# Patient Record
Sex: Female | Born: 1965 | Race: Black or African American | Hispanic: No | Marital: Married | Smoking: Never smoker
Health system: Southern US, Community
[De-identification: ages and names within clinical notes are randomized; demographics above are authoritative.]

## PROBLEM LIST (undated history)

## (undated) DIAGNOSIS — D573 Sickle-cell trait: Secondary | ICD-10-CM

## (undated) DIAGNOSIS — I10 Essential (primary) hypertension: Secondary | ICD-10-CM

## (undated) DIAGNOSIS — F32A Depression, unspecified: Secondary | ICD-10-CM

## (undated) HISTORY — PX: LAPAROSCOPIC GASTRIC BANDING: SHX1100

## (undated) HISTORY — PX: ABDOMINAL HYSTERECTOMY: SHX81

---

## 2015-02-20 ENCOUNTER — Ambulatory Visit
Admission: RE | Admit: 2015-02-20 | Discharge: 2015-02-20 | Disposition: A | Discharge status: Home / Self Care | Payer: Disability Insurance | Source: Ambulatory Visit | Attending: Physical Medicine and Rehabilitation | Admitting: Physical Medicine and Rehabilitation

## 2015-02-20 ENCOUNTER — Other Ambulatory Visit: Payer: Self-pay | Admitting: Physical Medicine and Rehabilitation

## 2015-02-20 DIAGNOSIS — M19071 Primary osteoarthritis, right ankle and foot: Secondary | ICD-10-CM | POA: Diagnosis not present

## 2015-02-20 DIAGNOSIS — M25559 Pain in unspecified hip: Secondary | ICD-10-CM

## 2015-02-20 DIAGNOSIS — S82891S Other fracture of right lower leg, sequela: Secondary | ICD-10-CM

## 2015-02-20 DIAGNOSIS — Z9889 Other specified postprocedural states: Secondary | ICD-10-CM | POA: Diagnosis not present

## 2015-02-20 DIAGNOSIS — M25551 Pain in right hip: Secondary | ICD-10-CM | POA: Insufficient documentation

## 2015-06-05 ENCOUNTER — Encounter: Payer: Self-pay | Admitting: Emergency Medicine

## 2015-06-05 ENCOUNTER — Ambulatory Visit
Admission: EM | Admit: 2015-06-05 | Discharge: 2015-06-05 | Disposition: A | Discharge status: Home / Self Care | Payer: Self-pay | Attending: Family Medicine | Admitting: Family Medicine

## 2015-06-05 ENCOUNTER — Ambulatory Visit (INDEPENDENT_AMBULATORY_CARE_PROVIDER_SITE_OTHER): Payer: Disability Insurance

## 2015-06-05 DIAGNOSIS — A499 Bacterial infection, unspecified: Secondary | ICD-10-CM

## 2015-06-05 DIAGNOSIS — S8012XA Contusion of left lower leg, initial encounter: Secondary | ICD-10-CM

## 2015-06-05 DIAGNOSIS — M62830 Muscle spasm of back: Secondary | ICD-10-CM

## 2015-06-05 DIAGNOSIS — N760 Acute vaginitis: Secondary | ICD-10-CM

## 2015-06-05 DIAGNOSIS — B9689 Other specified bacterial agents as the cause of diseases classified elsewhere: Secondary | ICD-10-CM

## 2015-06-05 DIAGNOSIS — B373 Candidiasis of vulva and vagina: Secondary | ICD-10-CM

## 2015-06-05 DIAGNOSIS — B3731 Acute candidiasis of vulva and vagina: Secondary | ICD-10-CM

## 2015-06-05 LAB — URINALYSIS COMPLETE WITH MICROSCOPIC (ARMC ONLY)
Bacteria, UA: NONE SEEN
GLUCOSE, UA: NEGATIVE mg/dL
Hgb urine dipstick: NEGATIVE
LEUKOCYTES UA: NEGATIVE
NITRITE: NEGATIVE
PH: 6 (ref 5.0–8.0)
PROTEIN: NEGATIVE mg/dL
RBC / HPF: NONE SEEN RBC/hpf (ref 0–5)
Specific Gravity, Urine: 1.025 (ref 1.005–1.030)

## 2015-06-05 LAB — WET PREP, GENITAL
Clue Cells Wet Prep HPF POC: NONE SEEN
Sperm: NONE SEEN
Trich, Wet Prep: NONE SEEN
WBC, Wet Prep HPF POC: NONE SEEN

## 2015-06-05 MED ORDER — KETOROLAC TROMETHAMINE 60 MG/2ML IM SOLN
60.0000 mg | Freq: Once | INTRAMUSCULAR | Status: AC
  Administered 2015-06-05: 60 mg via INTRAMUSCULAR

## 2015-06-05 MED ORDER — TERCONAZOLE 0.8 % VA CREA: 1.0000 | TOPICAL_CREAM | Freq: Every day | VAGINAL | Status: AC

## 2015-06-05 MED ORDER — METAXALONE 800 MG PO TABS: 800.0000 mg | ORAL_TABLET | Freq: Three times a day (TID) | ORAL | Status: AC

## 2015-06-05 MED ORDER — NAPROXEN 500 MG PO TABS: 500.0000 mg | ORAL_TABLET | Freq: Two times a day (BID) | ORAL | Status: AC

## 2015-06-05 MED ORDER — METRONIDAZOLE 500 MG PO TABS: 500.0000 mg | ORAL_TABLET | Freq: Two times a day (BID) | ORAL | Status: DC

## 2015-06-05 NOTE — Discharge Instructions (Signed)
Bacterial Vaginosis Bacterial vaginosis is an infection of the vagina. It happens when too many germs (bacteria) grow in the vagina. Having this infection puts you at risk for getting other infections from sex. Treating this infection can help lower your risk for other infections, such as:   Chlamydia.  Gonorrhea.  HIV.  Herpes. HOME CARE  Take your medicine as told by your doctor.  Finish your medicine even if you start to feel better.  Tell your sex partner that you have an infection. They should see their doctor for treatment.  During treatment:  Avoid sex or use condoms correctly.  Do not douche.  Do not drink alcohol unless your doctor tells you it is ok.  Do not breastfeed unless your doctor tells you it is ok. GET HELP IF:  You are not getting better after 3 days of treatment.  You have more grey fluid (discharge) coming from your vagina than before.  You have more pain than before.  You have a fever. MAKE SURE YOU:   Understand these instructions.  Will watch your condition.  Will get help right away if you are not doing well or get worse.   This information is not intended to replace advice given to you by your health care provider. Make sure you discuss any questions you have with your health care provider.   Document Released: 12/04/2007 Document Revised: 03/17/2014 Document Reviewed: 10/06/2012 Elsevier Interactive Patient Education 2016 Elsevier Inc.  Muscle Cramps and Spasms Muscle cramps and spasms are when muscles tighten by themselves. They usually get better within minutes. Muscle cramps are painful. They are usually stronger and last longer than muscle spasms. Muscle spasms may or may not be painful. They can last a few seconds or much longer. HOME CARE  Drink enough fluid to keep your pee (urine) clear or pale yellow.  Massage, stretch, and relax the muscle.  Use a warm towel, heating pad, or warm shower water on tight muscles.  Place  ice on the muscle if it is tender or in pain.  Put ice in a plastic bag.  Place a towel between your skin and the bag.  Leave the ice on for 15-20 minutes, 03-04 times a day.  Only take medicine as told by your doctor. GET HELP RIGHT AWAY IF:  Your cramps or spasms get worse, happen more often, or do not get better with time. MAKE SURE YOU:  Understand these instructions.  Will watch your condition.  Will get help right away if you are not doing well or get worse.   This information is not intended to replace advice given to you by your health care provider. Make sure you discuss any questions you have with your health care provider.   Document Released: 02/07/2008 Document Revised: 06/21/2012 Document Reviewed: 02/11/2012 Elsevier Interactive Patient Education 2016 [employer].  Vaginitis Vaginitis is an inflammation of the vagina. It is most often caused by a change in the normal balance of the bacteria and yeast that live in the vagina. This change in balance causes an overgrowth of certain bacteria or yeast, which causes the inflammation. There are different types of vaginitis, but the most common types are:  Bacterial vaginosis.  Yeast infection (candidiasis).  Trichomoniasis vaginitis. This is a sexually transmitted infection (STI).  Viral vaginitis.  Atrophic vaginitis.  Allergic vaginitis. CAUSES  The cause depends on the type of vaginitis. Vaginitis can be caused by:  Bacteria (bacterial vaginosis).  Yeast (yeast infection).  A parasite (trichomoniasis  vaginitis)  A virus (viral vaginitis).  Low hormone levels (atrophic vaginitis). Low hormone levels can occur during pregnancy, breastfeeding, or after menopause.  Irritants, such as bubble baths, scented tampons, and feminine sprays (allergic vaginitis). Other factors can change the normal balance of the yeast and bacteria that live in the vagina. These include:  Antibiotic medicines.  Poor  hygiene.  Diaphragms, vaginal sponges, spermicides, birth control pills, and intrauterine devices (IUD).  Sexual intercourse.  Infection.  Uncontrolled diabetes.  A weakened immune system. SYMPTOMS  Symptoms can vary depending on the cause of the vaginitis. Common symptoms include:  Abnormal vaginal discharge.  The discharge is white, gray, or yellow with bacterial vaginosis.  The discharge is thick, white, and cheesy with a yeast infection.  The discharge is frothy and yellow or greenish with trichomoniasis.  A bad vaginal odor.  The odor is fishy with bacterial vaginosis.  Vaginal itching, pain, or swelling.  Painful intercourse.  Pain or burning when urinating. Sometimes, there are no symptoms. TREATMENT  Treatment will vary depending on the type of infection.   Bacterial vaginosis and trichomoniasis are often treated with antibiotic creams or pills.  Yeast infections are often treated with antifungal medicines, such as vaginal creams or suppositories.  Viral vaginitis has no cure, but symptoms can be treated with medicines that relieve discomfort. Your sexual partner should be treated as well.  Atrophic vaginitis may be treated with an estrogen cream, pill, suppository, or vaginal ring. If vaginal dryness occurs, lubricants and moisturizing creams may help. You may be told to avoid scented soaps, sprays, or douches.  Allergic vaginitis treatment involves quitting the use of the product that is causing the problem. Vaginal creams can be used to treat the symptoms. HOME CARE INSTRUCTIONS   Take all medicines as directed by your caregiver.  Keep your genital area clean and dry. Avoid soap and only rinse the area with water.  Avoid douching. It can remove the healthy bacteria in the vagina.  Do not use tampons or have sexual intercourse until your vaginitis has been treated. Use sanitary pads while you have vaginitis.  Wipe from front to back. This avoids the  spread of bacteria from the rectum to the vagina.  Let air reach your genital area.  Wear cotton underwear to decrease moisture buildup.  Avoid wearing underwear while you sleep until your vaginitis is gone.  Avoid tight pants and underwear or nylons without a cotton panel.  Take off wet clothing (especially bathing suits) as soon as possible.  Use mild, non-scented products. Avoid using irritants, such as:  Scented feminine sprays.  Fabric softeners.  Scented detergents.  Scented tampons.  Scented soaps or bubble baths.  Practice safe sex and use condoms. Condoms may prevent the spread of trichomoniasis and viral vaginitis. SEEK MEDICAL CARE IF:   You have abdominal pain.  You have a fever or persistent symptoms for more than 2-3 days.  You have a fever and your symptoms suddenly get worse.   This information is not intended to replace advice given to you by your health care provider. Make sure you discuss any questions you have with your health care provider.   Document Released: 12/22/2006 Document Revised: 07/11/2014 Document Reviewed: 08/07/2011 Elsevier Interactive Patient Education 2016 Elsevier Inc.  Monilial Vaginitis Vaginitis in a soreness, swelling and redness (inflammation) of the vagina and vulva. Monilial vaginitis is not a sexually transmitted infection. CAUSES  Yeast vaginitis is caused by yeast (candida) that is normally found in your  vagina. With a yeast infection, the candida has overgrown in number to a point that upsets the chemical balance. SYMPTOMS   White, thick vaginal discharge.  Swelling, itching, redness and irritation of the vagina and possibly the lips of the vagina (vulva).  Burning or painful urination.  Painful intercourse. DIAGNOSIS  Things that may contribute to monilial vaginitis are:  Postmenopausal and virginal states.  Pregnancy.  Infections.  Being tired, sick or stressed, especially if you had monilial vaginitis in  the past.  Diabetes. Good control will help lower the chance.  Birth control pills.  Tight fitting garments.  Using bubble bath, feminine sprays, douches or deodorant tampons.  Taking certain medications that kill germs (antibiotics).  Sporadic recurrence can occur if you become ill. TREATMENT  Your caregiver will give you medication.  There are several kinds of anti monilial vaginal creams and suppositories specific for monilial vaginitis. For recurrent yeast infections, use a suppository or cream in the vagina 2 times a week, or as directed.  Anti-monilial or steroid cream for the itching or irritation of the vulva may also be used. Get your caregiver's permission.  Painting the vagina with methylene blue solution may help if the monilial cream does not work.  Eating yogurt may help prevent monilial vaginitis. HOME CARE INSTRUCTIONS   Finish all medication as prescribed.  Do not have sex until treatment is completed or after your caregiver tells you it is okay.  Take warm sitz baths.  Do not douche.  Do not use tampons, especially scented ones.  Wear cotton underwear.  Avoid tight pants and panty hose.  Tell your sexual partner that you have a yeast infection. They should go to their caregiver if they have symptoms such as mild rash or itching.  Your sexual partner should be treated as well if your infection is difficult to eliminate.  Practice safer sex. Use condoms.  Some vaginal medications cause latex condoms to fail. Vaginal medications that harm condoms are:  Cleocin cream.  Butoconazole (Femstat).  Terconazole (Terazol) vaginal suppository.  Miconazole (Monistat) (may be purchased over the counter). SEEK MEDICAL CARE IF:   You have a temperature by mouth above 102 F (38.9 C).  The infection is getting worse after 2 days of treatment.  The infection is not getting better after 3 days of treatment.  You develop blisters in or around your  vagina.  You develop vaginal bleeding, and it is not your menstrual period.  You have pain when you urinate.  You develop intestinal problems.  You have pain with sexual intercourse.   This information is not intended to replace advice given to you by your health care provider. Make sure you discuss any questions you have with your health care provider.   Document Released: 12/04/2004 Document Revised: 05/19/2011 Document Reviewed: 08/28/2014 Elsevier Interactive Patient Education [employer]2016 Elsevier Inc.

## 2015-06-05 NOTE — ED Notes (Signed)
Patient states she is having bad lower back pain left lower leg pain and a vaginal discharge

## 2015-06-05 NOTE — ED Provider Notes (Signed)
CSN: [id number]     Arrival date & time 06/05/15  1337 History   First MD Initiated Contact with Patient 06/05/15 1521     Chief Complaint  Patient presents with  . Back Pain   (Consider location/radiation/quality/duration/timing/severity/associated sxs/prior Treatment) HPI  This a 50 year old female who presents with lower back pain that is making it very difficult for her to move. She states that the pain was originally in the left side proximately L3 level and radiates over to the right side. He has no known injury. This is been bothering her for about the last 3 days. No radiation to the pain is very painful with any motion.  Her second complaint of left leg pain and discoloration around her distal leg above the ankle that is painful. Been present for  1 month. She states that it is worsening over time.She does remember injuring her leg when she fell down the stairs about a month ago. Since that time pain has worsened his noticed some redness around the area as well.  Her last complaint is that of a very voluminous vaginal discharge that is malodorous. She's had this for months. She has had it increasing  recently Soaking through pads and her panties. He states that she had a similar episode years ago that required one weeks of therapy of oral medication.    History reviewed. No pertinent past medical history. Past Surgical History  Procedure Laterality Date  . Laparoscopic gastric banding    . Abdominal hysterectomy     Family History  Problem Relation Age of Onset  . Adopted: Yes  . Family history unknown: Yes   Social History  Substance Use Topics  . Smoking status: Never Smoker   . Smokeless tobacco: None  . Alcohol Use: Yes   OB History    No data available     Review of Systems  Constitutional: Positive for activity change. Negative for fever, chills and fatigue.  Genitourinary: Positive for vaginal discharge.  All other systems reviewed and are  negative.   Allergies  Review of patient's allergies indicates not on file.  Home Medications   Prior to Admission medications   Medication Sig Start Date End Date Taking? Authorizing Provider  acyclovir (ZOVIRAX) 400 MG tablet Take 400 mg by mouth 5 (five) times daily.   Yes Historical Provider, MD  diclofenac (VOLTAREN) 25 MG EC tablet Take 25 mg by mouth 2 (two) times daily.   Yes Historical Provider, MD  losartan (COZAAR) 100 MG tablet Take 100 mg by mouth daily.   Yes Historical Provider, MD  sertraline (ZOLOFT) 100 MG tablet Take 100 mg by mouth daily.   Yes Historical Provider, MD  metaxalone (SKELAXIN) 800 MG tablet Take 1 tablet (800 mg total) by mouth 3 (three) times daily. 06/05/15   Lutricia Feil, PA-C  metroNIDAZOLE (FLAGYL) 500 MG tablet Take 1 tablet (500 mg total) by mouth 2 (two) times daily. 06/05/15   Lutricia Feil, PA-C  naproxen (NAPROSYN) 500 MG tablet Take 1 tablet (500 mg total) by mouth 2 (two) times daily with a meal. 06/05/15   Lutricia Feil, PA-C  terconazole (TERAZOL 3) 0.8 % vaginal cream Place 1 applicator vaginally at bedtime. 06/05/15   Lutricia Feil, PA-C   Meds Ordered and Administered this Visit   Medications  ketorolac (TORADOL) injection 60 mg (60 mg Intramuscular Given 06/05/15 1615)    BP 130/71 mmHg  Pulse 67  Temp(Src) 98.1 F (36.7 C) (Oral)  Resp  18  Ht 5\' 8"  (1.727 m)  Wt 285 lb (129.275 kg)  BMI 43.34 kg/m2  SpO2 99% No data found.   Physical Exam  Constitutional: She is oriented to person, place, and time. She appears well-developed and well-nourished. No distress.  HENT:  Head: Normocephalic and atraumatic.  Eyes: Conjunctivae are normal. Pupils are equal, round, and reactive to light.  Neck: Normal range of motion. Neck supple.  Genitourinary: Vagina normal.  Exam was performed with Lanora ManisElizabeth PA student. Several gentle is normal. No obvious discharge was noticed. Is no erythema present. Limited exam was then  performed. Speculum was introduced and samples were taken for GC and chlamydia as well as wet prep. Bimanual was performed as the patient preferred not to have any further semination performed. Specimens were cemented to the laboratory  Musculoskeletal:  Examination lumbar spine shows the patient to have a decidedly forward tilt is able with encouragement to straighten up most of the way. Extension is very uncomfortable. For flexion is also helpful. Flexion projected to the right is the most painful. He has palpable spasm and tenderness at the L3 level mostly in the left paraspinous muscles.  Examination of the left lower extremity shows induration and some mild erythema of the lateral distal tibia approximately 1 hand's breath proximal to the ankle. Area is tender. There is no skin breakdown seen. The patient has numerous small varicosities present. Remainder of the tissue is very soft and nontender. Is no obvious swelling of the calf in comparison to the opposite side.  Neurological: She is alert and oriented to person, place, and time.  Skin: Skin is warm and dry. She is not diaphoretic.  Psychiatric: She has a normal mood and affect. Her behavior is normal. Judgment and thought content normal.  Nursing note and vitals reviewed.   ED Course  Procedures (including critical care time)  Labs Review Labs Reviewed  WET PREP, GENITAL - Abnormal; Notable for the following:    Yeast Wet Prep HPF POC PRESENT (*)    All other components within normal limits  URINALYSIS COMPLETEWITH MICROSCOPIC ([facility] ONLY) - Abnormal; Notable for the following:    Bilirubin Urine 1+ (*)    Ketones, ur TRACE (*)    Squamous Epithelial / LPF 0-5 (*)    All other components within normal limits  CHLAMYDIA/NGC RT PCR [facility] ONLY)    Imaging Review Dg Tibia/fibula Left  06/05/2015  CLINICAL DATA:  Lower LEFT leg pain and swelling for 2-3 weeks, no known injury EXAM: LEFT TIBIA AND FIBULA - 2 VIEW  COMPARISON:  None FINDINGS: Scattered soft tissue swelling. Bones appear demineralized. Knee and ankle joint alignments normal. Spur formation at cranial margin of patellar articular surface. No acute fracture, dislocation or bone destruction. Few tiny calcified phleboliths in pretibial soft tissues in the mid lower leg. Tiny metallic foreign body at anteromedial LEFT lower leg versus placed marker. No acute osseous abnormalities identified. IMPRESSION: No acute osseous abnormalities. Patellofemoral degenerative changes. Marker versus metallic foreign body at anteromedial soft tissues of the distal LEFT lower leg. Electronically Signed   By: Ulyses SouthwardMark  Boles M.D.   On: 06/05/2015 17:30     Visual Acuity Review  Right Eye Distance:   Left Eye Distance:   Bilateral Distance:    Right Eye Near:   Left Eye Near:    Bilateral Near:       Discharge Medication List as of 06/05/2015  5:45 PM    START taking these medications   Details  metaxalone (SKELAXIN) 800 MG tablet Take 1 tablet (800 mg total) by mouth 3 (three) times daily., Starting 06/05/2015, Until Discontinued, Normal    metroNIDAZOLE (FLAGYL) 500 MG tablet Take 1 tablet (500 mg total) by mouth 2 (two) times daily., Starting 06/05/2015, Until Discontinued, Normal    naproxen (NAPROSYN) 500 MG tablet Take 1 tablet (500 mg total) by mouth 2 (two) times daily with a meal., Starting 06/05/2015, Until Discontinued, Normal    terconazole (TERAZOL 3) 0.8 % vaginal cream Place 1 applicator vaginally at bedtime., Starting 06/05/2015, Until Discontinued, Normal      Plan: 1. Test/x-ray results and diagnosis reviewed with patient 2. rx as per orders; risks, benefits, potential side effects reviewed with patient 3. Recommend supportive treatment with Rest and symptom avoidance for her leg her back. Given her medications for BV and also for yeast. She should follow-up with her primary care physician which she needs ongoing care. I've asked her to use  heat on her leg because of the contusion. 4. F/u prn if symptoms worsen or don't improve Medications  ketorolac (TORADOL) injection 60 mg (60 mg Intramuscular Given 06/05/15 1615)  .  MDM   1. Spasm of lumbar paraspinous muscle   2. BV (bacterial vaginosis)   3. Candida vaginitis   4. Contusion of left leg, initial encounter        Lutricia Feil, PA-C 06/05/15 1749

## 2015-06-06 LAB — CHLAMYDIA/NGC RT PCR (ARMC ONLY)
Chlamydia Tr: NOT DETECTED
N gonorrhoeae: NOT DETECTED

## 2017-04-10 IMAGING — CR DG TIBIA/FIBULA 2V*L*
2 series · 2 of 2 positions shown · non-contrast
Comparison: None

CLINICAL DATA: Lower LEFT leg pain and swelling for 2-3 weeks, no
known injury

EXAM:
LEFT TIBIA AND FIBULA - 2 VIEW

[tibia ap]
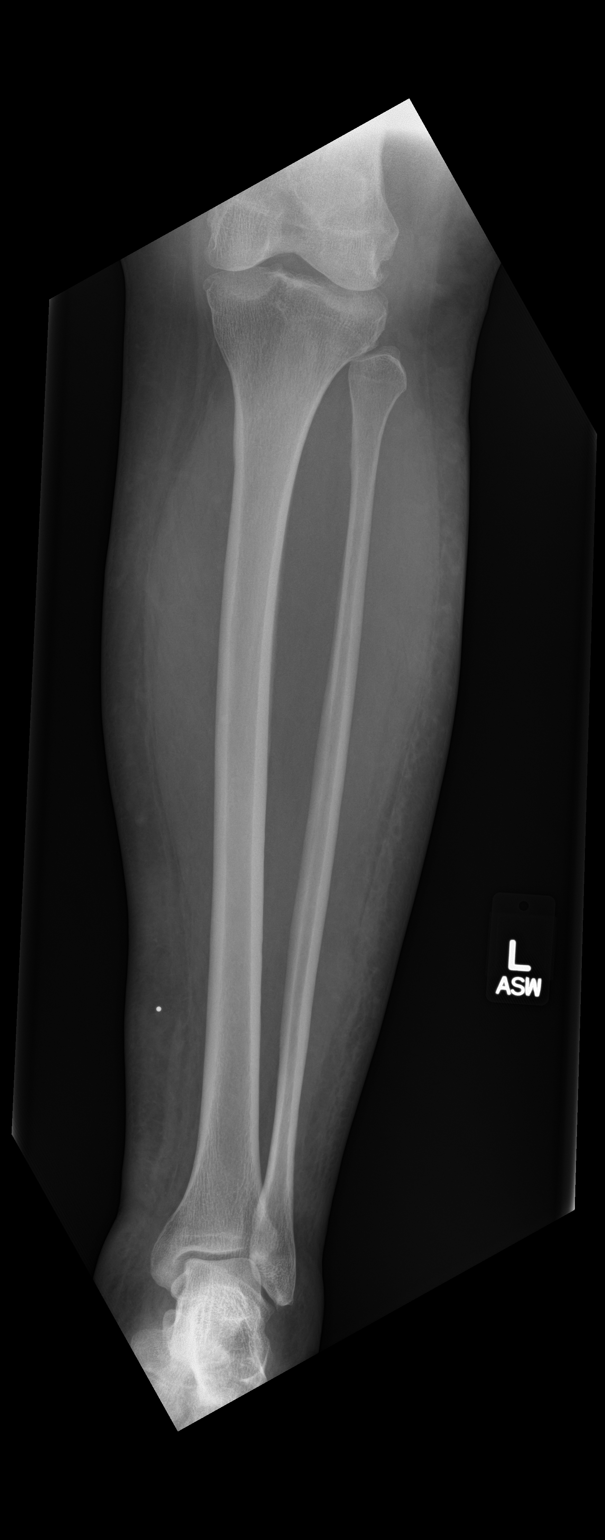

[tibia lat]
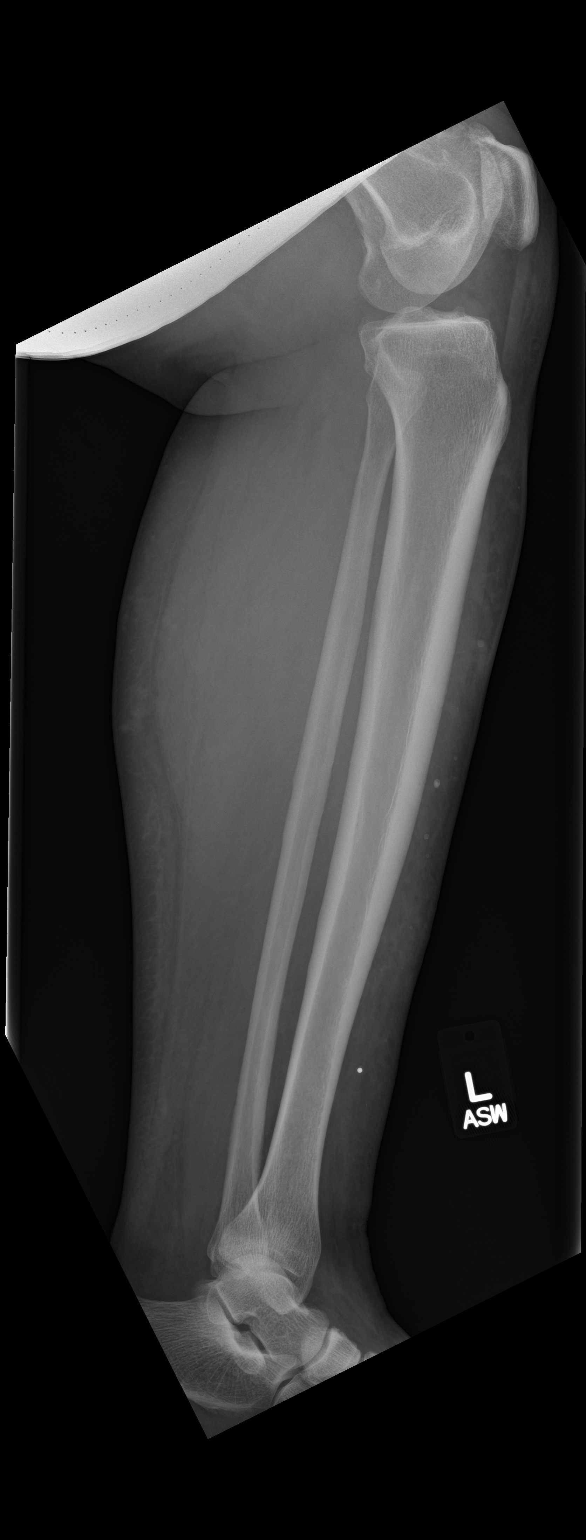

[2 of 2 positions shown; findings below may reference images not displayed]

FINDINGS: Scattered soft tissue swelling.

Bones appear demineralized.

Knee and ankle joint alignments normal.

Spur formation at cranial margin of patellar articular surface.

No acute fracture, dislocation or bone destruction.

Few tiny calcified phleboliths in pretibial soft tissues in the mid
lower leg.

Tiny metallic foreign body at anteromedial LEFT lower leg versus
placed marker.

No acute osseous abnormalities identified.
IMPRESSION: No acute osseous abnormalities.

Patellofemoral degenerative changes.

Marker versus metallic foreign body at anteromedial soft tissues of
the distal LEFT lower leg.

## 2022-11-25 ENCOUNTER — Ambulatory Visit
Admission: RE | Admit: 2022-11-25 | Discharge: 2022-11-25 | Disposition: A | Discharge status: Home / Self Care | Payer: Disability Insurance | Source: Ambulatory Visit | Attending: Emergency Medicine | Admitting: Emergency Medicine

## 2022-11-25 ENCOUNTER — Ambulatory Visit (INDEPENDENT_AMBULATORY_CARE_PROVIDER_SITE_OTHER): Payer: Disability Insurance

## 2022-11-25 ENCOUNTER — Other Ambulatory Visit: Payer: Self-pay

## 2022-11-25 VITALS — BP 137/74 | HR 80 | Temp 98.8°F | Resp 18

## 2022-11-25 DIAGNOSIS — J189 Pneumonia, unspecified organism: Secondary | ICD-10-CM

## 2022-11-25 HISTORY — DX: Sickle-cell trait: D57.3

## 2022-11-25 MED ORDER — AMOXICILLIN-POT CLAVULANATE 875-125 MG PO TABS: 1.0000 | ORAL_TABLET | Freq: Two times a day (BID) | ORAL | 0 refills | Status: AC

## 2022-11-25 MED ORDER — AMOXICILLIN-POT CLAVULANATE 875-125 MG PO TABS: 1.0000 | ORAL_TABLET | Freq: Two times a day (BID) | ORAL | 0 refills | Status: DC

## 2022-11-25 MED ORDER — DOXYCYCLINE HYCLATE 100 MG PO CAPS: 100.0000 mg | ORAL_CAPSULE | Freq: Two times a day (BID) | ORAL | 0 refills | Status: AC

## 2022-11-25 MED ORDER — DOXYCYCLINE HYCLATE 100 MG PO CAPS: 100.0000 mg | ORAL_CAPSULE | Freq: Two times a day (BID) | ORAL | 0 refills | Status: DC

## 2022-11-25 NOTE — ED Provider Notes (Signed)
[facility]    CSN: [id number] Arrival date & time: 11/25/22  1435      History   Chief Complaint Chief Complaint  Patient presents with   Appointment 2:30   coughj    HPI Shirley Davis is a 57 y.o. female.   57 year old female, Shirley Davis, presents to urgent care for evaluation of nasal congestion,chest congestion x 1 week, coughing keeps me up at night. "I  need some medicine", denies illness exposure. Has been using theraflu and over the counter.  The history is provided by the patient. No language interpreter was used.    Past Medical History:  Diagnosis Date   Sickle cell trait [facility])     Patient Active Problem List   Diagnosis Date Noted   Pneumonia of right upper lobe due to infectious organism 11/25/2022    Past Surgical History:  Procedure Laterality Date   ABDOMINAL HYSTERECTOMY     LAPAROSCOPIC GASTRIC BANDING      OB History   No obstetric history on file.      Home Medications    Prior to Admission medications   Medication Sig Start Date End Date Taking? Authorizing Provider  chlorthalidone (HYGROTON) 25 MG tablet Take 25 mg by mouth daily.   Yes [provider]  cyclobenzaprine (FLEXERIL) 10 MG tablet Take 10 mg by mouth 3 (three) times daily as needed for muscle spasms.   Yes [provider]  DULoxetine (CYMBALTA) 20 MG capsule Take 20 mg by mouth daily.   Yes [provider]  gabapentin (NEURONTIN) 100 MG capsule Take 100 mg by mouth 3 (three) times daily.   Yes [provider]  HYDROcodone-acetaminophen (NORCO) 7.5-325 MG tablet Take 1 tablet by mouth every 6 (six) hours as needed for moderate pain.   Yes [provider]  acyclovir (ZOVIRAX) 400 MG tablet Take 400 mg by mouth 5 (five) times daily.    [provider]  amoxicillin-clavulanate (AUGMENTIN) 875-125 MG tablet Take 1 tablet by mouth every 12 (twelve) hours for 5 days. 11/25/22 11/30/22  Amarianna Abplanalp, Para March, NP   diclofenac (VOLTAREN) 25 MG EC tablet Take 25 mg by mouth 2 (two) times daily.    [provider]  doxycycline (VIBRAMYCIN) 100 MG capsule Take 1 capsule (100 mg total) by mouth 2 (two) times daily for 5 days. 11/25/22 11/30/22  Eugen Jeansonne, Para March, NP  losartan (COZAAR) 100 MG tablet Take 100 mg by mouth daily.    [provider]  metaxalone (SKELAXIN) 800 MG tablet Take 1 tablet (800 mg total) by mouth 3 (three) times daily. 06/05/15   Lutricia Feil, PA-C  naproxen (NAPROSYN) 500 MG tablet Take 1 tablet (500 mg total) by mouth 2 (two) times daily with a meal. 06/05/15   Lutricia Feil, PA-C  sertraline (ZOLOFT) 100 MG tablet Take 100 mg by mouth daily.    [provider]  terconazole (TERAZOL 3) 0.8 % vaginal cream Place 1 applicator vaginally at bedtime. 06/05/15   Lutricia Feil, PA-C    Family History Family History  Adopted: Yes  Family history unknown: Yes    Social History Social History   Tobacco Use   Smoking status: Never  Substance Use Topics   Alcohol use: Yes     Allergies   Patient has no known allergies.   Review of Systems Review of Systems  HENT:  Positive for congestion.   Respiratory:  Positive for cough. Negative for wheezing.   All other systems  reviewed and are negative.    Physical Exam Triage Vital Signs ED Triage Vitals  Encounter Vitals Group     BP 11/25/22 1516 137/74     Systolic BP Percentile --      Diastolic BP Percentile --      Pulse Rate 11/25/22 1516 80     Resp 11/25/22 1516 18     Temp 11/25/22 1516 98.8 F (37.1 C)     Temp Source 11/25/22 1516 Oral     SpO2 11/25/22 1516 98 %     Weight --      Height --      Head Circumference --      Peak Flow --      Pain Score 11/25/22 1515 9     Pain Loc --      Pain Education --      Exclude from Growth Chart --    No data found.  Updated Vital Signs BP 137/74 (BP Location: Left Arm)   Pulse 80   Temp 98.8 F (37.1 C) (Oral)   Resp 18    SpO2 98%   Visual Acuity Right Eye Distance:   Left Eye Distance:   Bilateral Distance:    Right Eye Near:   Left Eye Near:    Bilateral Near:     Physical Exam Vitals and nursing note reviewed.  Constitutional:      General: She is not in acute distress.    Appearance: She is well-developed and well-groomed.  HENT:     Head: Normocephalic and atraumatic.  Eyes:     Conjunctiva/sclera: Conjunctivae normal.  Cardiovascular:     Rate and Rhythm: Normal rate and regular rhythm.     Pulses: Normal pulses.     Heart sounds: Normal heart sounds. No murmur heard. Pulmonary:     Effort: Pulmonary effort is normal. No respiratory distress.     Breath sounds: Decreased air movement present. Examination of the right-lower field reveals decreased breath sounds. Examination of the left-lower field reveals decreased breath sounds. Decreased breath sounds present.  Abdominal:     Palpations: Abdomen is soft.     Tenderness: There is no abdominal tenderness.  Musculoskeletal:        General: No swelling.     Cervical back: Neck supple.  Skin:    General: Skin is warm and dry.     Capillary Refill: Capillary refill takes less than 2 seconds.  Neurological:     General: No focal deficit present.     Mental Status: She is alert and oriented to person, place, and time.     GCS: GCS eye subscore is 4. GCS verbal subscore is 5. GCS motor subscore is 6.  Psychiatric:        Attention and Perception: Attention normal.        Mood and Affect: Mood normal.        Speech: Speech normal.        Behavior: Behavior normal. Behavior is cooperative.      UC Treatments / Results  Labs (all labs ordered are listed, but only abnormal results are displayed) Labs Reviewed - No data to display  EKG   Radiology DG Chest 2 View  Result Date: 11/25/2022 CLINICAL DATA:  Cough. EXAM: CHEST - 2 VIEW COMPARISON:  None Available. FINDINGS: Minimal ill-defined airspace opacity in the right upper lobe.  Left lung is clear. The heart is normal in size. Mediastinal contours are normal. No pleural fluid. No pneumothorax.  Normal pulmonary vasculature. No acute osseous findings. IMPRESSION: Minimal ill-defined airspace opacity in the right upper lobe, suspicious for pneumonia. Followup PA and lateral chest X-ray is recommended in 3-4 weeks after treatment to ensure resolution and exclude underlying malignancy. Electronically Signed   By: Narda Rutherford M.D.   On: 11/25/2022 16:35    Procedures Procedures (including critical care time)  Medications Ordered in UC Medications - No data to display  Initial Impression / Assessment and Plan / UC Course  I have reviewed the triage vital signs and the nursing notes.  Pertinent labs & imaging results that were available during my care of the patient were reviewed by me and considered in my medical decision making (see chart for details).     Ddx: Pneumonia right upper, allergies, viral illness Final Clinical Impressions(s) / UC Diagnoses   Final diagnoses:  Pneumonia of right upper lobe due to infectious organism     Discharge Instructions      Your xray shows concern for pneumonia vs underlying malignancy: Take antibiotics as directed. Drink plenty of water. Need Follow up PA and lateral chest X-ray in 3-4 weeks after treatment to ensure resolution and exclude underlying malignancy. GO to ER for new or worsening issues or concerns.      ED Prescriptions     Medication Sig Dispense Auth. Provider   amoxicillin-clavulanate (AUGMENTIN) 875-125 MG tablet  (Status: Discontinued) Take 1 tablet by mouth every 12 (twelve) hours for 5 days. 10 tablet Quint Chestnut, Para March, NP   doxycycline (VIBRAMYCIN) 100 MG capsule  (Status: Discontinued) Take 1 capsule (100 mg total) by mouth 2 (two) times daily for 5 days. 10 capsule Murel Wigle, NP   amoxicillin-clavulanate (AUGMENTIN) 875-125 MG tablet Take 1 tablet by mouth every 12 (twelve) hours for 5  days. 10 tablet Ben Habermann, Para March, NP   doxycycline (VIBRAMYCIN) 100 MG capsule Take 1 capsule (100 mg total) by mouth 2 (two) times daily for 5 days. 10 capsule Laqueta Bonaventura, Para March, NP      PDMP not reviewed this encounter.   Clancy Gourd, NP 11/25/22 2040

## 2022-11-25 NOTE — ED Notes (Signed)
Patient out in hallway.  Asked if she has been forgotten.  Reassured her we are waiting on the results of her chest ray.  Patient asking for medicine, reassured her provider will order what she feels is appropriate once results are received.  Patient verbalized understanding

## 2022-11-25 NOTE — ED Triage Notes (Signed)
Patient presents to [facility] for evaluation of nasal congestion and cough x 1 week, states it has been getting worse, making it difficult for her to sleep at night.  States she has discolored and sometimes bloody mucous.  Patient is using 'Theraflu and for cough tessalon perles.  Patient denies SOB, but states it's bad enough that she just can't sleep at night.

## 2022-11-25 NOTE — Discharge Instructions (Addendum)
Your xray shows concern for pneumonia vs underlying malignancy: Take antibiotics as directed. Drink plenty of water. Need Follow up PA and lateral chest X-ray in 3-4 weeks after treatment to ensure resolution and exclude underlying malignancy. GO to ER for new or worsening issues or concerns.

## 2023-05-06 ENCOUNTER — Ambulatory Visit
Admission: EM | Admit: 2023-05-06 | Discharge: 2023-05-06 | Disposition: A | Discharge status: Home / Self Care | Payer: Disability Insurance | Attending: Family Medicine | Admitting: Family Medicine

## 2023-05-06 ENCOUNTER — Ambulatory Visit (INDEPENDENT_AMBULATORY_CARE_PROVIDER_SITE_OTHER): Payer: Disability Insurance

## 2023-05-06 DIAGNOSIS — Z8709 Personal history of other diseases of the respiratory system: Secondary | ICD-10-CM | POA: Diagnosis not present

## 2023-05-06 DIAGNOSIS — R0602 Shortness of breath: Secondary | ICD-10-CM | POA: Insufficient documentation

## 2023-05-06 DIAGNOSIS — J189 Pneumonia, unspecified organism: Secondary | ICD-10-CM | POA: Insufficient documentation

## 2023-05-06 DIAGNOSIS — J101 Influenza due to other identified influenza virus with other respiratory manifestations: Secondary | ICD-10-CM | POA: Insufficient documentation

## 2023-05-06 HISTORY — DX: Essential (primary) hypertension: I10

## 2023-05-06 HISTORY — DX: Depression, unspecified: F32.A

## 2023-05-06 LAB — RESP PANEL BY RT-PCR (RSV, FLU A&B, COVID)  RVPGX2
Influenza A by PCR: POSITIVE — AB
Influenza B by PCR: NEGATIVE
Resp Syncytial Virus by PCR: NEGATIVE
SARS Coronavirus 2 by RT PCR: NEGATIVE

## 2023-05-06 MED ORDER — PREDNISONE 10 MG (21) PO TBPK: ORAL_TABLET | Freq: Every day | ORAL | 0 refills | Status: AC

## 2023-05-06 MED ORDER — AZITHROMYCIN 250 MG PO TABS: ORAL_TABLET | ORAL | 0 refills | Status: DC

## 2023-05-06 MED ORDER — ALBUTEROL SULFATE (2.5 MG/3ML) 0.083% IN NEBU
2.5000 mg | INHALATION_SOLUTION | Freq: Once | RESPIRATORY_TRACT | Status: AC
  Administered 2023-05-06: 2.5 mg via RESPIRATORY_TRACT

## 2023-05-06 MED ORDER — AMOXICILLIN-POT CLAVULANATE 875-125 MG PO TABS: 1.0000 | ORAL_TABLET | Freq: Two times a day (BID) | ORAL | 0 refills | Status: DC

## 2023-05-06 MED ORDER — ALBUTEROL SULFATE HFA 108 (90 BASE) MCG/ACT IN AERS: 2.0000 | INHALATION_SPRAY | RESPIRATORY_TRACT | 0 refills | Status: AC | PRN

## 2023-05-06 MED ORDER — DEXAMETHASONE SODIUM PHOSPHATE 10 MG/ML IJ SOLN
10.0000 mg | Freq: Once | INTRAMUSCULAR | Status: AC
  Administered 2023-05-06: 10 mg via INTRAMUSCULAR

## 2023-05-06 NOTE — Discharge Instructions (Addendum)
 You have influenza A in the setting of asthma.  Your symptoms will gradually improve over the next 7 to 10 days.  The cough may last about 3 weeks.   Take ibuprofen 600 mg with Tylenol 1000 mg for fever, headache or body aches.   For cough: You can also use guaifenesin and dextromethorphan for cough. You can use a humidifier for chest congestion and cough.  If you don't have a humidifier, you can sit in the bathroom with the hot shower running.      For sore throat: try warm salt water gargles, Mucinex sore throat cough drops or cepacol lozenges, throat spray, warm tea or water with lemon/honey, popsicles or ice, or OTC cold relief medicine for throat discomfort. You can also purchase chloraseptic spray at the pharmacy or dollar store.   For congestion: take a daily anti-histamine like Zyrtec, Claritin, and a oral decongestant, such as pseudoephedrine.  You can also use Flonase 1-2 sprays in each nostril daily. Afrin is also a good option, if you do not have high blood pressure.    It is important to stay hydrated: drink plenty of fluids (water, gatorade/powerade/pedialyte, juices, or teas) to keep your throat moisturized and help further relieve irritation/discomfort.    Return or go to the Emergency Department if symptoms worsen or do not improve in the next few days

## 2023-05-06 NOTE — ED Provider Notes (Signed)
 [facility]    CSN: [id number] Arrival date & time: 05/06/23  1025      History   Chief Complaint Chief Complaint  Patient presents with   Cough   Shortness of Breath   Wheezing   Headache    HPI Shirley Davis is a 58 y.o. female.   HPI  History obtained from the patient. Shirley Davis presents for wheezing, cough, shortness of breathe, and headache that started Saturday.  Yesterday, she vomited with some blood after coughing attack.  Hears crackling when she breathes.  Reports history of asthma but hasn't used an inhaler because she doesn't think she needs to use the inhaler.  No history smoker or vaping.    Past Medical History:  Diagnosis Date   Anxiety and depression    Hypertension    Sickle cell trait [facility])     Patient Active Problem List   Diagnosis Date Noted   Pneumonia of right upper lobe due to infectious organism 11/25/2022    Past Surgical History:  Procedure Laterality Date   ABDOMINAL HYSTERECTOMY     LAPAROSCOPIC GASTRIC BANDING      OB History   No obstetric history on file.      Home Medications    Prior to Admission medications   Medication Sig Start Date End Date Taking? Authorizing Provider  acyclovir (ZOVIRAX) 400 MG tablet Take 400 mg by mouth 5 (five) times daily.   Yes [provider]  albuterol (VENTOLIN HFA) 108 (90 Base) MCG/ACT inhaler Inhale 2 puffs into the lungs every 4 (four) hours as needed. 05/06/23  Yes Deamonte Sayegh, DO  amoxicillin-clavulanate (AUGMENTIN) 875-125 MG tablet Take 1 tablet by mouth every 12 (twelve) hours. 05/06/23  Yes Jimi Schappert, Seward Meth, DO  azithromycin (ZITHROMAX Z-PAK) 250 MG tablet Take 2 tablets on day 1 then 1 tablet daily 05/06/23  Yes Jasmia Angst, DO  chlorthalidone (HYGROTON) 25 MG tablet Take 25 mg by mouth daily.   Yes [provider]  cyclobenzaprine (FLEXERIL) 10 MG tablet Take 10 mg by mouth 3 (three) times daily as needed for muscle spasms.   Yes [provider]  DULoxetine (CYMBALTA) 20 MG capsule Take 20 mg by mouth daily.   Yes [provider]  gabapentin (NEURONTIN) 100 MG capsule Take 100 mg by mouth 3 (three) times daily.   Yes [provider]  HYDROcodone-acetaminophen (NORCO) 7.5-325 MG tablet Take 1 tablet by mouth every 6 (six) hours as needed for moderate pain.   Yes [provider]  ibuprofen (ADVIL) 600 MG tablet Take 600 mg by mouth every 6 (six) hours as needed.   Yes [provider]  losartan (COZAAR) 100 MG tablet Take 100 mg by mouth daily.   Yes [provider]  omeprazole (PRILOSEC) 20 MG capsule Take 20 mg by mouth daily.   Yes [provider]  predniSONE (STERAPRED UNI-PAK 21 TAB) 10 MG (21) TBPK tablet Take by mouth daily. Take 6 tabs by mouth daily for 1, then 5 tabs for 1 day, then 4 tabs for 1 day, then 3 tabs for 1 day, then 2 tabs for 1 day, then 1 tab for 1 day. 05/06/23  Yes Bellagrace Sylvan, Seward Meth, DO  rOPINIRole (REQUIP) 0.25 MG tablet Take by mouth. 02/20/23  Yes [provider]  diclofenac (VOLTAREN) 25 MG EC tablet Take 25 mg by mouth 2 (two) times daily.    [provider]  metaxalone (SKELAXIN) 800 MG tablet Take 1 tablet (800 mg total) by  mouth 3 (three) times daily. 06/05/15   Lutricia Feil, PA-C  naproxen (NAPROSYN) 500 MG tablet Take 1 tablet (500 mg total) by mouth 2 (two) times daily with a meal. 06/05/15   Lutricia Feil, PA-C  sertraline (ZOLOFT) 100 MG tablet Take 100 mg by mouth daily.    [provider]  terconazole (TERAZOL 3) 0.8 % vaginal cream Place 1 applicator vaginally at bedtime. 06/05/15   Lutricia Feil, PA-C    Family History Family History  Adopted: Yes  Family history unknown: Yes    Social History Social History   Tobacco Use   Smoking status: Never  Substance Use Topics   Alcohol use: Yes     Allergies   Ace inhibitors and Pineapple   Review of Systems Review of Systems: negative  unless otherwise stated in HPI.      Physical Exam Triage Vital Signs ED Triage Vitals  Encounter Vitals Group     BP 05/06/23 1146 134/84     Systolic BP Percentile --      Diastolic BP Percentile --      Pulse Rate 05/06/23 1146 83     Resp 05/06/23 1146 20     Temp 05/06/23 1146 98.6 F (37 C)     Temp Source 05/06/23 1146 Oral     SpO2 05/06/23 1146 92 %     Weight --      Height --      Head Circumference --      Peak Flow --      Pain Score 05/06/23 1142 5     Pain Loc --      Pain Education --      Exclude from Growth Chart --    No data found.  Updated Vital Signs BP 134/84 (BP Location: Left Arm)   Pulse 83   Temp 98.6 F (37 C) (Oral)   Resp 20   SpO2 92%   Visual Acuity Right Eye Distance:   Left Eye Distance:   Bilateral Distance:    Right Eye Near:   Left Eye Near:    Bilateral Near:     Physical Exam GEN:     alert, non-toxic appearing female in no distress    HENT:  mucus membranes moist, no nasal discharge RESP:  no increased work of breathing, coarse breathe sounds bilaterally but worse on the lower right, faint expiratory wheezing CVS:   regular rate and rhythm Skin:   warm and dry    UC Treatments / Results  Labs (all labs ordered are listed, but only abnormal results are displayed) Labs Reviewed  RESP PANEL BY RT-PCR (RSV, FLU A&B, COVID)  RVPGX2 - Abnormal; Notable for the following components:      Result Value   Influenza A by PCR POSITIVE (*)    All other components within normal limits    EKG   Radiology DG Chest 2 View Result Date: 05/06/2023 CLINICAL DATA:  Shortness of breath and cough. EXAM: CHEST - 2 VIEW COMPARISON:  Chest radiograph dated 11/25/2022. FINDINGS: Right lower lobe density with most consistent with developing infiltrate. Clinical correlation is recommended. The left lung is clear. No pleural effusion or pneumothorax. The cardiac silhouette is within normal limits. No acute osseous pathology. IMPRESSION:  Developing infiltrate in the right lower lobe. Follow-up to resolution recommended. Electronically Signed   By: Elgie Collard M.D.   On: 05/06/2023 13:03    Procedures Procedures (including critical care time)  Medications Ordered  in UC Medications  albuterol (PROVENTIL) (2.5 MG/3ML) 0.083% nebulizer solution 2.5 mg (2.5 mg Nebulization Given 05/06/23 1243)  dexamethasone (DECADRON) injection 10 mg (10 mg Intramuscular Given 05/06/23 1253)    Initial Impression / Assessment and Plan / UC Course  I have reviewed the triage vital signs and the nursing notes.  Pertinent labs & imaging results that were available during my care of the patient were reviewed by me and considered in my medical decision making (see chart for details).       Pt is a 58 y.o. female who reports history of asthma presents for 4 days of respiratory symptoms. Laurena is afebrile here without recent antipyretics. Satting  92% on room air. Given Decadron and albuterol inhaler.  Overall pt is non-toxic appearing, well hydrated, without respiratory distress. Pulmonary exam is unremarkable.  COVID and influenza panel obtained and was influenza A positive.  She is outside the window for treatment with Tamiflu. Chest xray obtained to evaluate for possible pneumonia given her lower oxygen saturations.   Chest xray personally reviewed by me questions RLL patchy pneumonia but no  pleural effusion, cardiomegaly or pneumothorax. Discussed symptomatic treatment.  Typical duration of symptoms discussed. Albuterol inhaler and prednisone prescribed.   Return and ED precautions given and voiced understanding. Discussed MDM, treatment plan and plan for follow-up with patient who agrees with plan.   Radiologist impression reviewed.  Pt may have developing pneumonia. Treat with dual antibiotics as below. Pt called and a secure voicemail left to pick up antibiotics from pharmacy for possible pneumonia.    Final Clinical Impressions(s) /  UC Diagnoses   Final diagnoses:  SOB (shortness of breath)  Influenza A with respiratory manifestations  Community acquired pneumonia of right lower lobe of lung  History of asthma     Discharge Instructions      You have influenza A in the setting of asthma.  Your symptoms will gradually improve over the next 7 to 10 days.  The cough may last about 3 weeks.   Take ibuprofen 600 mg with Tylenol 1000 mg for fever, headache or body aches.   For cough: You can also use guaifenesin and dextromethorphan for cough. You can use a humidifier for chest congestion and cough.  If you don't have a humidifier, you can sit in the bathroom with the hot shower running.      For sore throat: try warm salt water gargles, Mucinex sore throat cough drops or cepacol lozenges, throat spray, warm tea or water with lemon/honey, popsicles or ice, or OTC cold relief medicine for throat discomfort. You can also purchase chloraseptic spray at the pharmacy or dollar store.   For congestion: take a daily anti-histamine like Zyrtec, Claritin, and a oral decongestant, such as pseudoephedrine.  You can also use Flonase 1-2 sprays in each nostril daily. Afrin is also a good option, if you do not have high blood pressure.    It is important to stay hydrated: drink plenty of fluids (water, gatorade/powerade/pedialyte, juices, or teas) to keep your throat moisturized and help further relieve irritation/discomfort.    Return or go to the Emergency Department if symptoms worsen or do not improve in the next few days      ED Prescriptions     Medication Sig Dispense Auth. Provider   predniSONE (STERAPRED UNI-PAK 21 TAB) 10 MG (21) TBPK tablet Take by mouth daily. Take 6 tabs by mouth daily for 1, then 5 tabs for 1 day, then 4 tabs for  1 day, then 3 tabs for 1 day, then 2 tabs for 1 day, then 1 tab for 1 day. 21 tablet Alena Blankenbeckler, DO   albuterol (VENTOLIN HFA) 108 (90 Base) MCG/ACT inhaler Inhale 2 puffs into the  lungs every 4 (four) hours as needed. 6.7 g Lilias Lorensen, DO   amoxicillin-clavulanate (AUGMENTIN) 875-125 MG tablet Take 1 tablet by mouth every 12 (twelve) hours. 14 tablet Donevan Biller, DO   azithromycin (ZITHROMAX Z-PAK) 250 MG tablet Take 2 tablets on day 1 then 1 tablet daily 6 tablet Trevel Dillenbeck, DO      PDMP not reviewed this encounter.   Katha Cabal, DO 05/06/23 1333

## 2023-05-06 NOTE — ED Triage Notes (Signed)
 Sx for 1 week  Headache Wheezing Sob Productive cough with  blood in mucus

## 2023-12-04 ENCOUNTER — Ambulatory Visit (INDEPENDENT_AMBULATORY_CARE_PROVIDER_SITE_OTHER)

## 2023-12-04 ENCOUNTER — Ambulatory Visit
Admission: EM | Admit: 2023-12-04 | Discharge: 2023-12-04 | Disposition: A | Discharge status: Home / Self Care | Attending: Emergency Medicine | Admitting: Emergency Medicine

## 2023-12-04 ENCOUNTER — Encounter: Payer: Self-pay | Admitting: Emergency Medicine

## 2023-12-04 DIAGNOSIS — J40 Bronchitis, not specified as acute or chronic: Secondary | ICD-10-CM | POA: Diagnosis not present

## 2023-12-04 DIAGNOSIS — Z6841 Body Mass Index (BMI) 40.0 and over, adult: Secondary | ICD-10-CM | POA: Diagnosis present

## 2023-12-04 DIAGNOSIS — R051 Acute cough: Secondary | ICD-10-CM | POA: Diagnosis present

## 2023-12-04 DIAGNOSIS — R6889 Other general symptoms and signs: Secondary | ICD-10-CM

## 2023-12-04 LAB — GROUP A STREP BY PCR: Group A Strep by PCR: NOT DETECTED

## 2023-12-04 LAB — SARS CORONAVIRUS 2 BY RT PCR: SARS Coronavirus 2 by RT PCR: NEGATIVE

## 2023-12-04 MED ORDER — AZITHROMYCIN 250 MG PO TABS: ORAL_TABLET | ORAL | 0 refills | Status: AC

## 2023-12-04 MED ORDER — BENZONATATE 100 MG PO CAPS: 100.0000 mg | ORAL_CAPSULE | Freq: Three times a day (TID) | ORAL | 0 refills | Status: AC

## 2023-12-04 MED ORDER — AMOXICILLIN-POT CLAVULANATE 875-125 MG PO TABS: 1.0000 | ORAL_TABLET | Freq: Two times a day (BID) | ORAL | 0 refills | Status: DC

## 2023-12-04 NOTE — ED Triage Notes (Signed)
 Patient c/o cough, runny nose, and chest congestion that started 4 days ago.  Patient reports headaches and sore throat.

## 2023-12-04 NOTE — Discharge Instructions (Addendum)
 Your strep and covid are both negative. Most likely you have viral illness, bronchitis. Take meds as directed(Zpak, tessalon ). Drink plenty of water. Follow up with PCP.  If you develop chest pain,shortness of breath or worsening issues go to Er for further evaluation.

## 2023-12-04 NOTE — ED Provider Notes (Signed)
 [facility]    CSN: [id number] Arrival date & time: 12/04/23  1526      History   Chief Complaint Chief Complaint  Patient presents with   Nasal Congestion   Cough    HPI Shirley Davis is a 59 y.o. female.   58 year old female, Shirley Davis, presents to urgent care for evaluation of diarrhea,sneezing,sore throat, headache and cough x 4 days.   symptoms started with weather change recenlty  Pt denies smoking and drug use, occasional alcohol use.  The history is provided by the patient. No language interpreter was used.    Past Medical History:  Diagnosis Date   Anxiety and depression    Hypertension    Sickle cell trait     Patient Active Problem List   Diagnosis Date Noted   Bronchitis 12/04/2023   Acute cough 12/04/2023   Flu-like symptoms 12/04/2023   Morbid obesity with BMI of 40.0-44.9, adult (HCC) 12/04/2023   Pneumonia of right upper lobe due to infectious organism 11/25/2022    Past Surgical History:  Procedure Laterality Date   ABDOMINAL HYSTERECTOMY     LAPAROSCOPIC GASTRIC BANDING      OB History   No obstetric history on file.      Home Medications    Prior to Admission medications   Medication Sig Start Date End Date Taking? Authorizing Provider  amLODipine (NORVASC) 10 MG tablet Take 10 mg by mouth daily. 09/08/23 09/07/24 Yes [provider]  azithromycin  (ZITHROMAX  Z-PAK) 250 MG tablet Take as package directed 12/04/23  Yes Holt Woolbright, Rilla, NP  benzonatate  (TESSALON ) 100 MG capsule Take 1 capsule (100 mg total) by mouth every 8 (eight) hours for 5 days. 12/04/23 12/09/23 Yes Mercedees Convery, NP  Fluocinolone Acetonide Scalp 0.01 % OIL Apply one to two times a week 10/12/23  Yes [provider]  semaglutide-weight management (WEGOVY) 1.7 MG/0.75ML SOAJ SQ injection Inject 1.7 mg into the skin. 12/03/23  Yes [provider]  acyclovir (ZOVIRAX) 400 MG tablet Take 400 mg by mouth 5 (five) times daily.     [provider]  albuterol  (VENTOLIN  HFA) 108 (90 Base) MCG/ACT inhaler Inhale 2 puffs into the lungs every 4 (four) hours as needed. 05/06/23   Brimage, Vondra, DO  chlorthalidone (HYGROTON) 25 MG tablet Take 25 mg by mouth daily.    [provider]  cyclobenzaprine (FLEXERIL) 10 MG tablet Take 10 mg by mouth 3 (three) times daily as needed for muscle spasms.    [provider]  diclofenac (VOLTAREN) 25 MG EC tablet Take 25 mg by mouth 2 (two) times daily.    [provider]  DULoxetine (CYMBALTA) 20 MG capsule Take 20 mg by mouth daily.    [provider]  gabapentin (NEURONTIN) 100 MG capsule Take 100 mg by mouth 3 (three) times daily.    [provider]  HYDROcodone-acetaminophen (NORCO) 7.5-325 MG tablet Take 1 tablet by mouth every 6 (six) hours as needed for moderate pain.    [provider]  ibuprofen (ADVIL) 600 MG tablet Take 600 mg by mouth every 6 (six) hours as needed.    [provider]  losartan (COZAAR) 100 MG tablet Take 100 mg by mouth daily.    [provider]  metaxalone  (SKELAXIN ) 800 MG tablet Take 1 tablet (800 mg total) by mouth 3 (three) times daily. 06/05/15   Lacinda Elsie SQUIBB, PA-C  naproxen  (NAPROSYN ) 500 MG tablet Take 1 tablet (500 mg total) by mouth  2 (two) times daily with a meal. 06/05/15   Lacinda Elsie SQUIBB, PA-C  omeprazole (PRILOSEC) 20 MG capsule Take 20 mg by mouth daily.    [provider]  predniSONE  (STERAPRED UNI-PAK 21 TAB) 10 MG (21) TBPK tablet Take by mouth daily. Take 6 tabs by mouth daily for 1, then 5 tabs for 1 day, then 4 tabs for 1 day, then 3 tabs for 1 day, then 2 tabs for 1 day, then 1 tab for 1 day. 05/06/23   Brimage, Vondra, DO  rOPINIRole (REQUIP) 0.25 MG tablet Take by mouth. 02/20/23   [provider]  sertraline (ZOLOFT) 100 MG tablet Take 100 mg by mouth daily.    [provider]  terconazole  (TERAZOL 3 ) 0.8 % vaginal cream Place 1  applicator vaginally at bedtime. 06/05/15   Lacinda Elsie SQUIBB, PA-C    Family History Family History  Adopted: Yes  Family history unknown: Yes    Social History Social History   Tobacco Use   Smoking status: Never   Smokeless tobacco: Never  Vaping Use   Vaping status: Never Used  Substance Use Topics   Alcohol use: Yes     Allergies   Ace inhibitors and Pineapple   Review of Systems Review of Systems  Constitutional:  Negative for fever.  HENT:  Positive for congestion, rhinorrhea and sore throat.   Respiratory:  Positive for cough. Negative for shortness of breath, wheezing and stridor.   Cardiovascular:  Negative for chest pain and palpitations.  Gastrointestinal:  Positive for diarrhea.  Neurological:  Positive for headaches.  All other systems reviewed and are negative.    Physical Exam Triage Vital Signs ED Triage Vitals  Encounter Vitals Group     BP      Girls Systolic BP Percentile      Girls Diastolic BP Percentile      Boys Systolic BP Percentile      Boys Diastolic BP Percentile      Pulse      Resp      Temp      Temp src      SpO2      Weight      Height      Head Circumference      Peak Flow      Pain Score      Pain Loc      Pain Education      Exclude from Growth Chart    No data found.  Updated Vital Signs BP 111/63 (BP Location: Left Arm)   Pulse 78   Temp 97.9 F (36.6 C) (Oral)   Resp 15   Ht 5' 8 (1.727 m)   Wt 285 lb 0.9 oz (129.3 kg)   SpO2 94%   BMI 43.34 kg/m   Visual Acuity Right Eye Distance:   Left Eye Distance:   Bilateral Distance:    Right Eye Near:   Left Eye Near:    Bilateral Near:     Physical Exam Vitals and nursing note reviewed.  Constitutional:      General: She is not in acute distress.    Appearance: She is well-developed and well-groomed.  HENT:     Head: Normocephalic and atraumatic.     Right Ear: Tympanic membrane is retracted.     Left Ear: Tympanic membrane is retracted.      Nose: Congestion present.     Mouth/Throat:     Lips: Pink.     Mouth:  Mucous membranes are moist.     Pharynx: Oropharynx is clear. Uvula midline.  Eyes:     Conjunctiva/sclera: Conjunctivae normal.  Cardiovascular:     Rate and Rhythm: Normal rate and regular rhythm.     Heart sounds: Normal heart sounds. No murmur heard. Pulmonary:     Effort: Pulmonary effort is normal. No respiratory distress.     Breath sounds: Normal breath sounds and air entry.  Abdominal:     Palpations: Abdomen is soft.     Tenderness: There is no abdominal tenderness.  Musculoskeletal:        General: No swelling.     Cervical back: Neck supple.  Skin:    General: Skin is warm and dry.     Capillary Refill: Capillary refill takes less than 2 seconds.  Neurological:     General: No focal deficit present.     Mental Status: She is alert and oriented to person, place, and time.     GCS: GCS eye subscore is 4. GCS verbal subscore is 5. GCS motor subscore is 6.  Psychiatric:        Attention and Perception: Attention normal.        Mood and Affect: Mood normal.        Speech: Speech normal.        Behavior: Behavior normal. Behavior is cooperative.      UC Treatments / Results  Labs (all labs ordered are listed, but only abnormal results are displayed) Labs Reviewed  SARS CORONAVIRUS 2 BY RT PCR  GROUP A STREP BY PCR    EKG   Radiology DG Chest 2 View Result Date: 12/04/2023 CLINICAL DATA:  Cough, runny nose, and chest congestion starting 4 days ago. Headache and sore throat. EXAM: CHEST - 2 VIEW COMPARISON:  05/06/2023 FINDINGS: The heart size and mediastinal contours are within normal limits. Both lungs are clear. The visualized skeletal structures are unremarkable. IMPRESSION: No active cardiopulmonary disease. Electronically Signed   By: Elsie Gravely M.D.   On: 12/04/2023 16:30    Procedures Procedures (including critical care time)  Medications Ordered in UC Medications - No data  to display  Initial Impression / Assessment and Plan / UC Course  I have reviewed the triage vital signs and the nursing notes.  Pertinent labs & imaging results that were available during my care of the patient were reviewed by me and considered in my medical decision making (see chart for details).  Clinical Course as of 12/04/23 1723  Fri Dec 04, 2023  1616 Cxr ordered productive cough, hx of pneumonia; strep,covid ordered by staff due to symptoms. [JD]  1631 Strep/covid negative [JD]    Clinical Course User Index [JD] Yamileth Hayse, Rilla, NP   Discussed exam findings and plan of care with patient, strict go to ER precautions given.   Patient verbalized understanding to this provider.  Ddx: Flu like symptoms, acute cough, viral illness, morbid obesity Final Clinical Impressions(s) / UC Diagnoses   Final diagnoses:  Morbid obesity with BMI of 40.0-44.9, adult (HCC)  Flu-like symptoms  Acute cough  Bronchitis     Discharge Instructions      Your strep and covid are both negative. Most likely you have viral illness, bronchitis. Take meds as directed(Zpak, tessalon ). Drink plenty of water. Follow up with PCP.  If you develop chest pain,shortness of breath or worsening issues go to Er for further evaluation.     ED Prescriptions     Medication Sig Dispense Auth.  Provider   amoxicillin -clavulanate (AUGMENTIN ) 875-125 MG tablet  (Status: Discontinued) Take 1 tablet by mouth every 12 (twelve) hours for 5 days. 10 tablet Ausha Sieh, NP   azithromycin  (ZITHROMAX  Z-PAK) 250 MG tablet Take as package directed 6 tablet Janiyla Long, NP   benzonatate  (TESSALON ) 100 MG capsule Take 1 capsule (100 mg total) by mouth every 8 (eight) hours for 5 days. 15 capsule Kaspian Muccio, NP      PDMP not reviewed this encounter.   Aminta Loose, NP 12/04/23 1723
